# Patient Record
Sex: Female | Born: 1945 | Race: Asian | Hispanic: No | Marital: Married
Health system: Southern US, Community
[De-identification: ages and names within clinical notes are randomized; demographics above are authoritative.]

---

## 1998-05-30 ENCOUNTER — Other Ambulatory Visit: Admission: RE | Admit: 1998-05-30 | Discharge: 1998-05-30 | Payer: Self-pay | Admitting: Obstetrics and Gynecology

## 1998-06-01 ENCOUNTER — Other Ambulatory Visit: Admission: RE | Admit: 1998-06-01 | Discharge: 1998-06-01 | Payer: Self-pay | Admitting: Obstetrics and Gynecology

## 1999-03-22 ENCOUNTER — Other Ambulatory Visit: Admission: RE | Admit: 1999-03-22 | Discharge: 1999-03-22 | Payer: Self-pay | Admitting: Obstetrics and Gynecology

## 1999-04-20 ENCOUNTER — Ambulatory Visit (HOSPITAL_COMMUNITY): Admission: RE | Admit: 1999-04-20 | Discharge: 1999-04-20 | Payer: Self-pay | Admitting: *Deleted

## 1999-09-28 ENCOUNTER — Other Ambulatory Visit: Admission: RE | Admit: 1999-09-28 | Discharge: 1999-09-28 | Payer: Self-pay | Admitting: Gynecology

## 2000-03-07 ENCOUNTER — Encounter: Admission: RE | Admit: 2000-03-07 | Discharge: 2000-03-07 | Payer: Self-pay | Admitting: Gastroenterology

## 2000-03-07 ENCOUNTER — Encounter: Payer: Self-pay | Admitting: Gastroenterology

## 2000-06-26 ENCOUNTER — Emergency Department (HOSPITAL_COMMUNITY): Admission: EM | Admit: 2000-06-26 | Discharge: 2000-06-26 | Payer: Self-pay | Admitting: Emergency Medicine

## 2000-06-26 ENCOUNTER — Encounter: Payer: Self-pay | Admitting: Emergency Medicine

## 2000-06-27 ENCOUNTER — Encounter: Payer: Self-pay | Admitting: Emergency Medicine

## 2000-06-27 ENCOUNTER — Emergency Department (HOSPITAL_COMMUNITY): Admission: EM | Admit: 2000-06-27 | Discharge: 2000-06-28 | Payer: Self-pay | Admitting: Emergency Medicine

## 2001-08-13 ENCOUNTER — Other Ambulatory Visit: Admission: RE | Admit: 2001-08-13 | Discharge: 2001-08-13 | Payer: Self-pay | Admitting: Obstetrics and Gynecology

## 2001-08-20 ENCOUNTER — Encounter: Admission: RE | Admit: 2001-08-20 | Discharge: 2001-08-20 | Payer: Self-pay | Admitting: Obstetrics and Gynecology

## 2001-08-20 ENCOUNTER — Encounter: Payer: Self-pay | Admitting: Obstetrics and Gynecology

## 2002-09-10 ENCOUNTER — Other Ambulatory Visit: Admission: RE | Admit: 2002-09-10 | Discharge: 2002-09-10 | Payer: Self-pay | Admitting: Gynecology

## 2003-01-04 ENCOUNTER — Emergency Department (HOSPITAL_COMMUNITY): Admission: EM | Admit: 2003-01-04 | Discharge: 2003-01-04 | Payer: Self-pay | Admitting: Emergency Medicine

## 2003-01-04 ENCOUNTER — Encounter: Payer: Self-pay | Admitting: Emergency Medicine

## 2003-01-20 ENCOUNTER — Emergency Department (HOSPITAL_COMMUNITY): Admission: EM | Admit: 2003-01-20 | Discharge: 2003-01-20 | Payer: Self-pay | Admitting: Emergency Medicine

## 2003-01-20 ENCOUNTER — Encounter: Payer: Self-pay | Admitting: Emergency Medicine

## 2004-05-12 ENCOUNTER — Other Ambulatory Visit: Admission: RE | Admit: 2004-05-12 | Discharge: 2004-05-12 | Payer: Self-pay | Admitting: Gynecology

## 2006-03-22 ENCOUNTER — Encounter: Admission: RE | Admit: 2006-03-22 | Discharge: 2006-03-22 | Payer: Self-pay | Admitting: Otolaryngology

## 2007-01-29 ENCOUNTER — Other Ambulatory Visit: Admission: RE | Admit: 2007-01-29 | Discharge: 2007-01-29 | Payer: Self-pay | Admitting: Gynecology

## 2008-03-29 ENCOUNTER — Ambulatory Visit: Payer: Self-pay | Admitting: Nurse Practitioner

## 2008-03-29 DIAGNOSIS — J309 Allergic rhinitis, unspecified: Secondary | ICD-10-CM | POA: Insufficient documentation

## 2008-03-29 DIAGNOSIS — J45909 Unspecified asthma, uncomplicated: Secondary | ICD-10-CM | POA: Insufficient documentation

## 2008-03-30 ENCOUNTER — Encounter (INDEPENDENT_AMBULATORY_CARE_PROVIDER_SITE_OTHER): Payer: Self-pay | Admitting: Nurse Practitioner

## 2008-03-31 ENCOUNTER — Ambulatory Visit (HOSPITAL_COMMUNITY): Admission: RE | Admit: 2008-03-31 | Discharge: 2008-03-31 | Payer: Self-pay | Admitting: Nurse Practitioner

## 2008-04-06 ENCOUNTER — Ambulatory Visit: Payer: Self-pay | Admitting: Nurse Practitioner

## 2008-04-06 DIAGNOSIS — K219 Gastro-esophageal reflux disease without esophagitis: Secondary | ICD-10-CM | POA: Insufficient documentation

## 2008-04-06 DIAGNOSIS — F17200 Nicotine dependence, unspecified, uncomplicated: Secondary | ICD-10-CM | POA: Insufficient documentation

## 2008-04-26 ENCOUNTER — Ambulatory Visit: Payer: Self-pay | Admitting: Nurse Practitioner

## 2008-04-26 DIAGNOSIS — Z780 Asymptomatic menopausal state: Secondary | ICD-10-CM | POA: Insufficient documentation

## 2008-04-26 DIAGNOSIS — R030 Elevated blood-pressure reading, without diagnosis of hypertension: Secondary | ICD-10-CM | POA: Insufficient documentation

## 2008-04-26 DIAGNOSIS — K921 Melena: Secondary | ICD-10-CM | POA: Insufficient documentation

## 2008-04-26 LAB — CONVERTED CEMR LAB

## 2008-05-03 ENCOUNTER — Encounter (INDEPENDENT_AMBULATORY_CARE_PROVIDER_SITE_OTHER): Payer: Self-pay | Admitting: Nurse Practitioner

## 2008-05-03 DIAGNOSIS — D649 Anemia, unspecified: Secondary | ICD-10-CM

## 2008-05-03 DIAGNOSIS — E0590 Thyrotoxicosis, unspecified without thyrotoxic crisis or storm: Secondary | ICD-10-CM | POA: Insufficient documentation

## 2008-08-19 ENCOUNTER — Ambulatory Visit: Payer: Self-pay | Admitting: Nurse Practitioner

## 2008-08-19 DIAGNOSIS — K006 Disturbances in tooth eruption: Secondary | ICD-10-CM

## 2008-08-19 DIAGNOSIS — Z862 Personal history of diseases of the blood and blood-forming organs and certain disorders involving the immune mechanism: Secondary | ICD-10-CM

## 2008-08-19 DIAGNOSIS — Z8639 Personal history of other endocrine, nutritional and metabolic disease: Secondary | ICD-10-CM

## 2008-08-20 ENCOUNTER — Encounter (INDEPENDENT_AMBULATORY_CARE_PROVIDER_SITE_OTHER): Payer: Self-pay | Admitting: Nurse Practitioner

## 2008-08-20 LAB — CONVERTED CEMR LAB
HCT: 33.4 % — ABNORMAL LOW (ref 36.0–46.0)
Hemoglobin: 11 g/dL — ABNORMAL LOW (ref 12.0–15.0)
MCV: 73.7 fL — ABNORMAL LOW (ref 78.0–100.0)
WBC: 6.3 10*3/uL (ref 4.0–10.5)

## 2008-08-23 ENCOUNTER — Encounter (INDEPENDENT_AMBULATORY_CARE_PROVIDER_SITE_OTHER): Payer: Self-pay | Admitting: Nurse Practitioner

## 2008-09-01 ENCOUNTER — Ambulatory Visit: Payer: Self-pay | Admitting: *Deleted

## 2009-02-16 ENCOUNTER — Ambulatory Visit: Payer: Self-pay | Admitting: Nurse Practitioner

## 2009-02-16 DIAGNOSIS — M542 Cervicalgia: Secondary | ICD-10-CM

## 2009-02-16 DIAGNOSIS — M549 Dorsalgia, unspecified: Secondary | ICD-10-CM | POA: Insufficient documentation

## 2009-02-23 ENCOUNTER — Encounter (INDEPENDENT_AMBULATORY_CARE_PROVIDER_SITE_OTHER): Payer: Self-pay | Admitting: Nurse Practitioner

## 2009-08-10 ENCOUNTER — Ambulatory Visit: Payer: Self-pay | Admitting: Physician Assistant

## 2009-08-10 ENCOUNTER — Encounter (INDEPENDENT_AMBULATORY_CARE_PROVIDER_SITE_OTHER): Payer: Self-pay | Admitting: Nurse Practitioner

## 2009-08-10 DIAGNOSIS — R109 Unspecified abdominal pain: Secondary | ICD-10-CM | POA: Insufficient documentation

## 2009-08-10 LAB — CONVERTED CEMR LAB
Bilirubin Urine: NEGATIVE
Ketones, urine, test strip: NEGATIVE
Nitrite: NEGATIVE
Protein, U semiquant: NEGATIVE
Urobilinogen, UA: 0.2

## 2009-08-11 LAB — CONVERTED CEMR LAB
Albumin: 4.7 g/dL (ref 3.5–5.2)
Alkaline Phosphatase: 59 units/L (ref 39–117)
BUN: 13 mg/dL (ref 6–23)
Basophils Absolute: 0.2 10*3/uL — ABNORMAL HIGH (ref 0.0–0.1)
Basophils Relative: 2 % — ABNORMAL HIGH (ref 0–1)
Creatinine, Ser: 0.63 mg/dL (ref 0.40–1.20)
Eosinophils Absolute: 0.6 10*3/uL (ref 0.0–0.7)
Eosinophils Relative: 7 % — ABNORMAL HIGH (ref 0–5)
Glucose, Bld: 88 mg/dL (ref 70–99)
HCT: 34.7 % — ABNORMAL LOW (ref 36.0–46.0)
Hemoglobin: 11.2 g/dL — ABNORMAL LOW (ref 12.0–15.0)
Lipase: 20 units/L (ref 0–75)
MCHC: 32.3 g/dL (ref 30.0–36.0)
MCV: 74.9 fL — ABNORMAL LOW (ref 78.0–100.0)
Monocytes Absolute: 0.6 10*3/uL (ref 0.1–1.0)
Potassium: 4.6 meq/L (ref 3.5–5.3)
RDW: 12.9 % (ref 11.5–15.5)
Total Bilirubin: 0.3 mg/dL (ref 0.3–1.2)

## 2009-08-17 ENCOUNTER — Ambulatory Visit (HOSPITAL_COMMUNITY): Admission: RE | Admit: 2009-08-17 | Discharge: 2009-08-17 | Payer: Self-pay | Admitting: Internal Medicine

## 2010-06-13 ENCOUNTER — Ambulatory Visit: Payer: Self-pay | Admitting: Nurse Practitioner

## 2010-06-13 LAB — CONVERTED CEMR LAB
Blood in Urine, dipstick: NEGATIVE
Glucose, Urine, Semiquant: NEGATIVE
Specific Gravity, Urine: 1.02
WBC Urine, dipstick: NEGATIVE
pH: 5

## 2010-06-15 ENCOUNTER — Encounter (INDEPENDENT_AMBULATORY_CARE_PROVIDER_SITE_OTHER): Payer: Self-pay | Admitting: *Deleted

## 2010-06-22 LAB — CONVERTED CEMR LAB
ALT: 19 units/L (ref 0–35)
Basophils Absolute: 0.1 10*3/uL (ref 0.0–0.1)
CO2: 28 meq/L (ref 19–32)
Calcium: 9.7 mg/dL (ref 8.4–10.5)
Chloride: 108 meq/L (ref 96–112)
Cholesterol: 183 mg/dL (ref 0–200)
Hemoglobin: 11 g/dL — ABNORMAL LOW (ref 12.0–15.0)
Lymphocytes Relative: 20 % (ref 12–46)
Neutro Abs: 3.8 10*3/uL (ref 1.7–7.7)
Platelets: 338 10*3/uL (ref 150–400)
Potassium: 4.5 meq/L (ref 3.5–5.3)
RDW: 14 % (ref 11.5–15.5)
Sodium: 145 meq/L (ref 135–145)
Total Protein: 7.2 g/dL (ref 6.0–8.3)

## 2010-08-06 ENCOUNTER — Encounter: Payer: Self-pay | Admitting: Family Medicine

## 2010-08-08 ENCOUNTER — Ambulatory Visit: Admit: 2010-08-08 | Payer: Self-pay | Admitting: Nurse Practitioner

## 2010-08-13 LAB — CONVERTED CEMR LAB
ALT: 40 units/L — ABNORMAL HIGH (ref 0–35)
AST: 32 units/L (ref 0–37)
BUN: 14 mg/dL (ref 6–23)
Basophils Absolute: 0.1 10*3/uL (ref 0.0–0.1)
Basophils Relative: 1 % (ref 0–1)
Calcium: 9.5 mg/dL (ref 8.4–10.5)
Chloride: 106 meq/L (ref 96–112)
Cholesterol: 160 mg/dL (ref 0–200)
Creatinine, Ser: 0.57 mg/dL (ref 0.40–1.20)
Eosinophils Relative: 1 % (ref 0–5)
HCT: 35.1 % — ABNORMAL LOW (ref 36.0–46.0)
HDL: 83 mg/dL (ref >39)
Helicobacter pylori, IgM: 30
Hemoglobin: 11.3 g/dL — ABNORMAL LOW (ref 12.0–15.0)
Ketones, urine, test strip: NEGATIVE
MCHC: 32.2 g/dL (ref 30.0–36.0)
MCV: 75.8 fL — ABNORMAL LOW (ref 78.0–100.0)
Monocytes Absolute: 0.4 10*3/uL (ref 0.1–1.0)
Monocytes Relative: 5 % (ref 3–12)
Neutro Abs: 5.5 10*3/uL (ref 1.7–7.7)
Nitrite: NEGATIVE
RBC: 4.63 M/uL (ref 3.87–5.11)
RDW: 13.9 % (ref 11.5–15.5)
Specific Gravity, Urine: 1.02
TSH: 0.067 microintl units/mL — ABNORMAL LOW (ref 0.350–4.50)
Total Bilirubin: 0.7 mg/dL (ref 0.3–1.2)
Total CHOL/HDL Ratio: 1.9
Urobilinogen, UA: 0.2
VLDL: 14 mg/dL (ref 0–40)
WBC Urine, dipstick: NEGATIVE

## 2010-08-17 NOTE — Letter (Signed)
Summary: *HSN Results Follow up  [facility]  [address] [place], [place] [address]   Phone: [phone]  Fax: [phone]      06/15/2010   Morgan Conway [address] [place], [place]  [address]   Dear  Ms. Silvio Pate Carnell,                            ____S.Drinkard,FNP   ____D. Gore,FNP       ____B. McPherson,MD   ____V. Rankins,MD    ____E. Mulberry,MD    ____N. Daphine Deutscher, FNP  ____D. Reche Dixon, MD    ____K. Philipp Deputy, MD    ____Other     This letter is to inform you that your recent test(s):  _______Pap Smear    _______Lab Test     _______X-ray    _______ is within acceptable limits  _______ requires a medication change  _______ requires a follow-up lab visit  _______ requires a follow-up visit with your provider   Comments:  We have been trying to reach you at [phone].  Please contact the office at your earliest convenience.       _________________________________________________________ If you have any questions, please contact our office                     Sincerely,  Levon Hedger [facility]

## 2010-08-17 NOTE — Letter (Signed)
Summary: TEST ORDER FORM//ULTRASOUND /ABDOMINAL PAIN//APPT DATE & TIME  TEST ORDER FORM//ULTRASOUND /ABDOMINAL PAIN//APPT DATE & TIME   Imported By: Arta Bruce 09/05/2009 12:35:09  _____________________________________________________________________  External Attachment:    Type:   Image     Comment:   External Document
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
## 2010-08-17 NOTE — Assessment & Plan Note (Signed)
Summary: Acute - Allergic Rhinitis   Vital Signs:  Patient profile:   65 year old female Weight:      101.1 pounds BMI:     22.75 Temp:     97.1 degrees F oral Pulse rate:   72 / minute Pulse rhythm:   regular Resp:     16 per minute BP sitting:   130 / 90  (left arm) Cuff size:   regular  Vitals Entered By: Levon Hedger (June 13, 2010 10:03 AM) CC: iching in ears, itching in head and pain in neck for a while and wants to be seen for this, Abdominal Pain, Back Pain Is Patient Diabetic? No Pain Assessment Patient in pain? no       Does patient need assistance? Functional Status Self care Ambulation Normal   CC:  iching in ears, itching in head and pain in neck for a while and wants to be seen for this, Abdominal Pain, and Back Pain.  History of Present Illness:  Pt into the office to f/u for headache. However pt c/o itchy ears and head. Internal canal itching +sneezing +nasal irritation -frontal headache -fever Pt previously prescribed claritin in 2009 but she has taken in over 1 year  Interpreter present today with pt - speaks [place]  Back Pain History:            Other comments:  no complaints of pain today.    Dyspepsia History:      She has no alarm features of dyspepsia including no history of melena, hematochezia, dysphagia, persistent vomiting, or involuntary weight loss > 5%.  There is a prior history of GERD.  The patient does not have a prior history of documented ulcer disease.      Allergies (verified): No Known Drug Allergies  Review of Systems General:  Denies fever. ENT:  Complains of nasal congestion and sinus pressure; denies earache and sore throat; +itchy ears. CV:  Denies chest pain or discomfort. Resp:  Denies cough. GI:  Complains of indigestion; took nexium this morning for indigestion.  Physical Exam  General:  alert.   Head:  normocephalic.   Eyes:  pupils round.   Ears:  bil clear fluid behind TM - no  erythema Lungs:  normal breath sounds.   Heart:  normal rate and regular rhythm.   Abdomen:  normal bowel sounds.   Msk:  up to the exam table Neurologic:  alert & oriented X3.   Skin:  color normal.   Psych:  Oriented X3.     Impression & Recommendations:  Problem # 1:  ALLERGIC RHINITIS (ICD-477.9) advised pt of dx will start meds Her updated medication list for this problem includes:    Loratadine 10 Mg Tabs (Loratadine) ..... One tablet by mouth daily for alleriges    Fluticasone Propionate 50 Mcg/act Susp (Fluticasone propionate) ..... One spray in each nostril two times a day **hold head down**  Problem # 2:  ELEVATED BLOOD PRESSURE WITHOUT DIAGNOSIS OF HYPERTENSION (ICD-796.2) will continue to monitor  Problem # 3:  HYPERTHYROIDISM (ICD-242.90) will check labs today Orders: T-TSH ([id number])  Problem # 4:  NEED PROPHYLACTIC VACCINATION&INOCULATION FLU (ICD-V04.81) given today in office  Problem # 5:  ANEMIA (ICD-285.9) given today in office Her updated medication list for this problem includes:    Ferrous Sulfate 324 Mg Tbec (Ferrous sulfate) .Marland Kitchen... 1 tablet by mouth daily to build up blood  Orders: T-Lipid Profile ([id number]) T-CBC w/Diff ([id number])  Complete Medication List: 1)  Loratadine  10 Mg Tabs (Loratadine) .... One tablet by mouth daily for alleriges 2)  Nexium 40 Mg Cpdr (Esomeprazole magnesium) .Marland Kitchen.. 1 tablet by mouth daily before breakfast 3)  Ferrous Sulfate 324 Mg Tbec (Ferrous sulfate) .Marland Kitchen.. 1 tablet by mouth daily to build up blood 4)  Fluticasone Propionate 50 Mcg/act Susp (Fluticasone propionate) .... One spray in each nostril two times a day **hold head down**  Other Orders: Flu Vaccine 89yrs + ([address]109) Admin 1st Vaccine (60454) T-Comprehensive Metabolic Panel ([id number]) UA Dipstick w/o Micro (manual) (29562)  Patient Instructions: 1)  You have received the flu vaccine today 2)  Your labs will be checked and you will be notified of  the results 3)  Ear itching and nasal congestion is most likely due to inflammed sinuses. 4)  You should take lorantadine 10mg  by mouth daily 5)  Also use the nasal spray two times a day (hold head down) 6)  Your complete physical exam is past due.  If you decide to have this done then schedule an appointment. Prescriptions: FLUTICASONE PROPIONATE 50 MCG/ACT SUSP (FLUTICASONE PROPIONATE) One spray in each nostril two times a day **hold head down**  #1 x 5   Entered and Authorized by:   Lehman Prom FNP   Signed by:   Lehman Prom FNP on 06/13/2010   Method used:   Print then Give to Patient   RxID:   [id number] LORATADINE 10 MG TABS (LORATADINE) One tablet by mouth daily for alleriges  #30 x 5   Entered and Authorized by:   Lehman Prom FNP   Signed by:   Lehman Prom FNP on 06/13/2010   Method used:   Print then Give to Patient   RxID:   [id number]    Orders Added: 1)  Flu Vaccine 6yrs + [64403] 2)  Admin 1st Vaccine [90471] 3)  Est. Patient Level III [47425] 4)  T-Lipid Profile [95638-75643] 5)  T-Comprehensive Metabolic Panel [80053-22900] 6)  T-CBC w/Diff [32951-88416] 7)  T-TSH [60630-16010] 8)  UA Dipstick w/o Micro (manual) [81002]   Immunizations Administered:  Influenza Vaccine # 1:    Vaccine Type: Fluvax 3+    Site: right deltoid    Mfr: GlaxoSmithKline    Dose: 0.5 ml    Route: IM    Given by: Levon Hedger    Exp. Date: 01/13/2011    Lot #: [id number]    VIS given: 02/07/10 version given June 13, 2010.  Flu Vaccine Consent Questions:    Do you have a history of severe allergic reactions to this vaccine? no    Any prior history of allergic reactions to egg and/or gelatin? no    Do you have a sensitivity to the preservative Thimersol? no    Do you have a past history of Guillan-Barre Syndrome? no    Do you currently have an acute febrile illness? no    Have you ever had a severe reaction to latex? no    Vaccine information  given and explained to patient? yes    Are you currently pregnant? no    ndc  [phone]  Immunizations Administered:  Influenza Vaccine # 1:    Vaccine Type: Fluvax 3+    Site: right deltoid    Mfr: GlaxoSmithKline    Dose: 0.5 ml    Route: IM    Given by: Levon Hedger    Exp. Date: 01/13/2011    Lot #: [id number]    VIS given: 02/07/10 version given June 13, 2010.  Laboratory Results  Urine Tests  Date/Time Received: June 13, 2010 10:17 AM   Routine Urinalysis   Color: lt. yellow Appearance: Clear Glucose: negative   (Normal Range: Negative) Bilirubin: negative   (Normal Range: Negative) Ketone: negative   (Normal Range: Negative) Spec. Gravity: 1.020   (Normal Range: 1.003-1.035) Blood: negative   (Normal Range: Negative) pH: 5.0   (Normal Range: 5.0-8.0) Protein: negative   (Normal Range: Negative) Urobilinogen: 0.2   (Normal Range: 0-1) Nitrite: negative   (Normal Range: Negative) Leukocyte Esterace: negative   (Normal Range: Negative)          Prevention & Chronic Care Immunizations   Influenza vaccine: Fluvax 3+  (06/13/2010)    Tetanus booster: 04/26/2008: Tdap    Pneumococcal vaccine: Not documented    H. zoster vaccine: Not documented  Colorectal Screening   Hemoccult: Not documented    Colonoscopy: Not documented  Other Screening   Pap smear:  Specimen Adequacy: Satisfactory for evaluation.   Interpretation/Result:Negative for intraepithelial Lesion or Malignancy.     (04/26/2008)   Pap smear action/deferral: PAP smear done  (04/26/2008)    Mammogram: Not documented   Mammogram action/deferral: mammogram ordered  (04/26/2008)    DXA bone density scan: Not documented   Smoking status: current  (02/16/2009)   Smoking cessation counseling: yes  (04/26/2008)  Lipids   Total Cholesterol: 160  (04/26/2008)   LDL: 63  (04/26/2008)   LDL Direct: Not documented   HDL: 83  (04/26/2008)   Triglycerides: 72  (04/26/2008)

## 2013-04-29 ENCOUNTER — Ambulatory Visit
Admission: RE | Admit: 2013-04-29 | Discharge: 2013-04-29 | Disposition: A | Discharge status: Home / Self Care | Payer: Medicare Other | Source: Ambulatory Visit | Attending: Internal Medicine | Admitting: Internal Medicine

## 2013-04-29 ENCOUNTER — Other Ambulatory Visit: Payer: Self-pay | Admitting: Internal Medicine

## 2013-04-29 DIAGNOSIS — R52 Pain, unspecified: Secondary | ICD-10-CM

## 2013-04-29 DIAGNOSIS — M542 Cervicalgia: Secondary | ICD-10-CM

## 2013-05-12 ENCOUNTER — Other Ambulatory Visit: Payer: Self-pay | Admitting: Internal Medicine

## 2013-05-12 DIAGNOSIS — Z1231 Encounter for screening mammogram for malignant neoplasm of breast: Secondary | ICD-10-CM

## 2013-06-04 ENCOUNTER — Ambulatory Visit
Admission: RE | Admit: 2013-06-04 | Discharge: 2013-06-04 | Disposition: A | Discharge status: Home / Self Care | Payer: Medicare Other | Source: Ambulatory Visit | Attending: Internal Medicine | Admitting: Internal Medicine

## 2013-06-04 DIAGNOSIS — Z1231 Encounter for screening mammogram for malignant neoplasm of breast: Secondary | ICD-10-CM

## 2013-08-05 ENCOUNTER — Other Ambulatory Visit: Payer: Self-pay | Admitting: Internal Medicine

## 2013-08-05 DIAGNOSIS — R1030 Lower abdominal pain, unspecified: Secondary | ICD-10-CM

## 2013-08-12 ENCOUNTER — Ambulatory Visit
Admission: RE | Admit: 2013-08-12 | Discharge: 2013-08-12 | Disposition: A | Discharge status: Home / Self Care | Payer: Medicare Other | Source: Ambulatory Visit | Attending: Internal Medicine | Admitting: Internal Medicine

## 2013-08-12 DIAGNOSIS — R1030 Lower abdominal pain, unspecified: Secondary | ICD-10-CM

## 2014-02-10 ENCOUNTER — Other Ambulatory Visit: Payer: Self-pay | Admitting: Internal Medicine

## 2014-02-10 DIAGNOSIS — M8580 Other specified disorders of bone density and structure, unspecified site: Secondary | ICD-10-CM

## 2014-03-01 ENCOUNTER — Ambulatory Visit
Admission: RE | Admit: 2014-03-01 | Discharge: 2014-03-01 | Disposition: A | Discharge status: Home / Self Care | Payer: Medicare Other | Source: Ambulatory Visit | Attending: Internal Medicine | Admitting: Internal Medicine

## 2014-03-01 DIAGNOSIS — M8580 Other specified disorders of bone density and structure, unspecified site: Secondary | ICD-10-CM

## 2014-06-28 ENCOUNTER — Other Ambulatory Visit: Payer: Self-pay | Admitting: Internal Medicine

## 2014-06-28 DIAGNOSIS — R52 Pain, unspecified: Secondary | ICD-10-CM

## 2014-07-05 ENCOUNTER — Ambulatory Visit
Admission: RE | Admit: 2014-07-05 | Discharge: 2014-07-05 | Disposition: A | Discharge status: Home / Self Care | Payer: Medicare Other | Source: Ambulatory Visit | Attending: Internal Medicine | Admitting: Internal Medicine

## 2014-07-05 ENCOUNTER — Other Ambulatory Visit: Payer: Medicare Other

## 2014-07-05 DIAGNOSIS — R52 Pain, unspecified: Secondary | ICD-10-CM

## 2015-09-08 ENCOUNTER — Ambulatory Visit
Admission: RE | Admit: 2015-09-08 | Discharge: 2015-09-08 | Disposition: A | Discharge status: Home / Self Care | Payer: Medicare Other | Source: Ambulatory Visit | Attending: Internal Medicine | Admitting: Internal Medicine

## 2015-09-08 ENCOUNTER — Other Ambulatory Visit: Payer: Self-pay | Admitting: Internal Medicine

## 2015-09-08 DIAGNOSIS — R05 Cough: Secondary | ICD-10-CM

## 2015-09-08 DIAGNOSIS — R059 Cough, unspecified: Secondary | ICD-10-CM

## 2022-01-03 ENCOUNTER — Emergency Department (HOSPITAL_COMMUNITY): Payer: Medicare Other

## 2022-01-03 ENCOUNTER — Encounter (HOSPITAL_COMMUNITY): Payer: Self-pay

## 2022-01-03 ENCOUNTER — Other Ambulatory Visit: Payer: Self-pay

## 2022-01-03 ENCOUNTER — Emergency Department (HOSPITAL_COMMUNITY)
Admission: EM | Admit: 2022-01-03 | Discharge: 2022-01-03 | Disposition: A | Discharge status: Home / Self Care | Payer: Medicare Other | Attending: Emergency Medicine | Admitting: Emergency Medicine

## 2022-01-03 DIAGNOSIS — D72829 Elevated white blood cell count, unspecified: Secondary | ICD-10-CM | POA: Diagnosis not present

## 2022-01-03 DIAGNOSIS — N201 Calculus of ureter: Secondary | ICD-10-CM | POA: Diagnosis not present

## 2022-01-03 DIAGNOSIS — R1032 Left lower quadrant pain: Secondary | ICD-10-CM | POA: Diagnosis present

## 2022-01-03 DIAGNOSIS — R630 Anorexia: Secondary | ICD-10-CM | POA: Insufficient documentation

## 2022-01-03 LAB — URINALYSIS, ROUTINE W REFLEX MICROSCOPIC
Bilirubin Urine: NEGATIVE
Glucose, UA: NEGATIVE mg/dL
Ketones, ur: NEGATIVE mg/dL
Leukocytes,Ua: NEGATIVE
Nitrite: NEGATIVE
Protein, ur: NEGATIVE mg/dL
Specific Gravity, Urine: 1.002 — ABNORMAL LOW (ref 1.005–1.030)
pH: 5 (ref 5.0–8.0)

## 2022-01-03 LAB — COMPREHENSIVE METABOLIC PANEL
ALT: 14 U/L (ref 0–44)
AST: 20 U/L (ref 15–41)
Albumin: 4.4 g/dL (ref 3.5–5.0)
Alkaline Phosphatase: 65 U/L (ref 38–126)
Anion gap: 13 (ref 5–15)
BUN: 6 mg/dL — ABNORMAL LOW (ref 8–23)
CO2: 26 mmol/L (ref 22–32)
Calcium: 10.1 mg/dL (ref 8.9–10.3)
Chloride: 102 mmol/L (ref 98–111)
Creatinine, Ser: 0.79 mg/dL (ref 0.44–1.00)
GFR, Estimated: >60 mL/min (ref >60)
Glucose, Bld: 121 mg/dL — ABNORMAL HIGH (ref 70–99)
Potassium: 4.4 mmol/L (ref 3.5–5.1)
Sodium: 141 mmol/L (ref 135–145)
Total Bilirubin: 0.6 mg/dL (ref 0.3–1.2)
Total Protein: 8 g/dL (ref 6.5–8.1)

## 2022-01-03 LAB — CBC
HCT: 35.4 % — ABNORMAL LOW (ref 36.0–46.0)
Hemoglobin: 11.6 g/dL — ABNORMAL LOW (ref 12.0–15.0)
MCH: 24.5 pg — ABNORMAL LOW (ref 26.0–34.0)
MCHC: 32.8 g/dL (ref 30.0–36.0)
MCV: 74.7 fL — ABNORMAL LOW (ref 80.0–100.0)
Platelets: 421 K/uL — ABNORMAL HIGH (ref 150–400)
RBC: 4.74 MIL/uL (ref 3.87–5.11)
RDW: 13.2 % (ref 11.5–15.5)
WBC: 14.5 K/uL — ABNORMAL HIGH (ref 4.0–10.5)
nRBC: 0 % (ref 0.0–0.2)

## 2022-01-03 LAB — LIPASE, BLOOD: Lipase: 38 U/L (ref 11–51)

## 2022-01-03 LAB — LACTIC ACID, PLASMA: Lactic Acid, Venous: 2.1 mmol/L (ref 0.5–1.9)

## 2022-01-03 MED ORDER — LACTATED RINGERS IV BOLUS
1000.0000 mL | Freq: Once | INTRAVENOUS | Status: AC
  Administered 2022-01-03: 1000 mL via INTRAVENOUS

## 2022-01-03 MED ORDER — KETOROLAC TROMETHAMINE 30 MG/ML IJ SOLN
15.0000 mg | Freq: Once | INTRAMUSCULAR | Status: DC
  Filled 2022-01-03: qty 1

## 2022-01-03 MED ORDER — ONDANSETRON HCL 4 MG/2ML IJ SOLN
4.0000 mg | Freq: Once | INTRAMUSCULAR | Status: DC
  Filled 2022-01-03: qty 2

## 2022-01-03 MED ORDER — ONDANSETRON 4 MG PO TBDP
4.0000 mg | ORAL_TABLET | Freq: Three times a day (TID) | ORAL | 0 refills | Status: AC | PRN
Start: 2022-01-03 — End: ?

## 2022-01-03 MED ORDER — CEPHALEXIN 500 MG PO CAPS: 500.0000 mg | ORAL_CAPSULE | Freq: Three times a day (TID) | ORAL | 0 refills | Status: AC

## 2022-01-03 MED ORDER — FENTANYL CITRATE PF 50 MCG/ML IJ SOSY
50.0000 ug | PREFILLED_SYRINGE | Freq: Once | INTRAMUSCULAR | Status: AC
  Administered 2022-01-03: 50 ug via INTRAVENOUS
  Filled 2022-01-03: qty 1

## 2022-01-03 MED ORDER — IBUPROFEN 600 MG PO TABS: 600.0000 mg | ORAL_TABLET | Freq: Three times a day (TID) | ORAL | 0 refills | Status: AC | PRN

## 2022-01-03 MED ORDER — IOHEXOL 300 MG/ML  SOLN
100.0000 mL | Freq: Once | INTRAMUSCULAR | Status: AC | PRN
  Administered 2022-01-03: 100 mL via INTRAVENOUS

## 2022-01-03 MED ORDER — ONDANSETRON HCL 4 MG/2ML IJ SOLN
4.0000 mg | Freq: Once | INTRAMUSCULAR | Status: AC
  Administered 2022-01-03: 4 mg via INTRAVENOUS
  Filled 2022-01-03: qty 2

## 2022-01-03 MED ORDER — SODIUM CHLORIDE 0.9 % IV SOLN
1.0000 g | Freq: Once | INTRAVENOUS | Status: AC
  Administered 2022-01-03: 1 g via INTRAVENOUS
  Filled 2022-01-03: qty 10

## 2022-01-03 MED ORDER — OXYCODONE-ACETAMINOPHEN 5-325 MG PO TABS
1.0000 | ORAL_TABLET | Freq: Once | ORAL | Status: AC
  Administered 2022-01-03: 1 via ORAL
  Filled 2022-01-03: qty 1

## 2022-01-03 NOTE — Discharge Instructions (Signed)
As we discussed, you have a small kidney stone.  You should be able to pass this on your own.  Take the pain medication you have at home as well as the ibuprofen.  You take ibuprofen every 6-8 hours for inflammation with your Vicodin in between.  Follow-up with the urologist.  Take the antibiotics as prescribed.  Return to the ED with increased pain, fever, vomiting, unable to urinate or any other concerns

## 2022-01-03 NOTE — ED Notes (Signed)
Patient transported to CT 

## 2022-01-03 NOTE — ED Triage Notes (Signed)
Pt arrived POV from home c/o sharp stabbing LLQ abdominal pain that started 3 days ago. Pt denies any N/V. Pt states she feels like something is sitting on her bladder.

## 2022-01-03 NOTE — ED Provider Notes (Signed)
[facility] EMERGENCY DEPARTMENT Provider Note   CSN: [id number] Arrival date & time: 01/03/22  1334     History  Chief Complaint  Patient presents with   Abdominal Pain    Morgan Conway is a 76 y.o. female.  Level 5 caveat for language barrier.  Family at bedside is able to translate.  Patient reports a 3-day history of progressively worsening lower abdominal pain worse in the midline in the left lower quadrant.  Pain is fairly constant and nothing makes it better and is steadily getting worse.  Associated with anorexia, poor appetite, nausea but no vomiting.  Did have 1 episode of diarrhea today after using MiraLAX.  Otherwise is moving her bowels normally.  Denies constipation or diarrhea history.  No pain with urination or blood in the urine.  No vaginal bleeding or discharge.  No fevers, chills, chest pain or shortness of breath.  No previous history of kidney stones.  No previous history of abdominal surgeries.  The history is provided by the patient. The history is limited by a language barrier. A language interpreter was used.  Abdominal Pain Associated symptoms: nausea   Associated symptoms: no chest pain, no cough, no diarrhea, no dysuria, no fever, no hematuria, no shortness of breath and no vomiting        Home Medications Prior to Admission medications   Not on File      Allergies    Patient has no known allergies.    Review of Systems   Review of Systems  Constitutional:  Positive for activity change and appetite change. Negative for fever.  HENT:  Negative for congestion and rhinorrhea.   Respiratory:  Negative for cough, chest tightness and shortness of breath.   Cardiovascular:  Negative for chest pain.  Gastrointestinal:  Positive for abdominal pain and nausea. Negative for diarrhea and vomiting.  Genitourinary:  Positive for urgency. Negative for dysuria and hematuria.  Musculoskeletal:  Negative for arthralgias and myalgias.  Skin:   Negative for rash.  Neurological:  Negative for dizziness, weakness, light-headedness and headaches.   all other systems are negative except as noted in the HPI and PMH.    Physical Exam Updated Vital Signs BP (!) 149/77   Pulse 70   Temp 98.6 F (37 C) (Oral)   Resp 15   Ht 4\' 9"  (1.448 m)   Wt 49.9 kg   SpO2 100%   BMI 23.80 kg/m  Physical Exam Vitals and nursing note reviewed.  Constitutional:      General: She is not in acute distress.    Appearance: She is well-developed.  HENT:     Head: Normocephalic and atraumatic.     Mouth/Throat:     Pharynx: No oropharyngeal exudate.  Eyes:     Conjunctiva/sclera: Conjunctivae normal.     Pupils: Pupils are equal, round, and reactive to light.  Neck:     Comments: No meningismus. Cardiovascular:     Rate and Rhythm: Normal rate and regular rhythm.     Heart sounds: Normal heart sounds. No murmur heard. Pulmonary:     Effort: Pulmonary effort is normal. No respiratory distress.     Breath sounds: Normal breath sounds.  Abdominal:     Palpations: Abdomen is soft.     Tenderness: There is abdominal tenderness. There is guarding. There is no rebound.     Comments: Suprapubic and left lower quadrant tenderness with voluntary guarding, no rebound  Musculoskeletal:  General: Tenderness present. Normal range of motion.     Cervical back: Normal range of motion and neck supple.     Comments: Left CVA tenderness  Skin:    General: Skin is warm.  Neurological:     Mental Status: She is alert and oriented to person, place, and time.     Cranial Nerves: No cranial nerve deficit.     Motor: No abnormal muscle tone.     Coordination: Coordination normal.     Comments:  5/5 strength throughout. CN 2-12 intact.Equal grip strength.   Psychiatric:        Behavior: Behavior normal.     ED Results / Procedures / Treatments   Labs (all labs ordered are listed, but only abnormal results are displayed) Labs Reviewed   COMPREHENSIVE METABOLIC PANEL - Abnormal; Notable for the following components:      Result Value   Glucose, Bld 121 (*)    BUN 6 (*)    All other components within normal limits  CBC - Abnormal; Notable for the following components:   WBC 14.5 (*)    Hemoglobin 11.6 (*)    HCT 35.4 (*)    MCV 74.7 (*)    MCH 24.5 (*)    Platelets 421 (*)    All other components within normal limits  URINALYSIS, ROUTINE W REFLEX MICROSCOPIC - Abnormal; Notable for the following components:   Color, Urine STRAW (*)    Specific Gravity, Urine 1.002 (*)    Hgb urine dipstick LARGE (*)    Bacteria, UA RARE (*)    All other components within normal limits  LACTIC ACID, PLASMA - Abnormal; Notable for the following components:   Lactic Acid, Venous 2.1 (*)    All other components within normal limits  URINE CULTURE  LIPASE, BLOOD  LACTIC ACID, PLASMA    EKG None  Radiology CT ABDOMEN PELVIS W CONTRAST  Result Date: 01/03/2022 CLINICAL DATA:  Left lower quadrant pain for several days EXAM: CT ABDOMEN AND PELVIS WITH CONTRAST TECHNIQUE: Multidetector CT imaging of the abdomen and pelvis was performed using the standard protocol following bolus administration of intravenous contrast. RADIATION DOSE REDUCTION: This exam was performed according to the departmental dose-optimization program which includes automated exposure control, adjustment of the mA and/or kV according to patient size and/or use of iterative reconstruction technique. CONTRAST:  OMNIPAQUE IOHEXOL 300 MG/ML  SOLN COMPARISON:  None Available. FINDINGS: Lower chest: Mild dependent atelectatic changes are noted. No focal infiltrate is seen. Hepatobiliary: No focal liver abnormality is seen. No gallstones, gallbladder wall thickening, or biliary dilatation. Pancreas: Pancreas is atrophic. Spleen: Normal in size without focal abnormality. Adrenals/Urinary Tract: Adrenal glands are within normal limits. Kidneys are well visualized bilaterally  within normal enhancement pattern. A few small subcentimeter cysts are noted the left kidney. No follow-up is recommended. Fullness of the left renal collecting system and left ureter is seen which extends to the level of the ureterovesical junction. A punctate 1 mm stone is noted at that level causing the obstructive change. Additionally the insertion site is somewhat lower than expected. Bladder is well distended. Stomach/Bowel: Mild diverticular change of the colon is noted without evidence of diverticulitis. No obstructive or inflammatory changes are seen. The appendix is well visualized and within normal limits. No inflammatory changes are seen. Small bowel and stomach are within normal limits. Vascular/Lymphatic: Aortic atherosclerosis. No enlarged abdominal or pelvic lymph nodes. Reproductive: Uterus and bilateral adnexa are unremarkable. Other: No abdominal wall hernia  or abnormality. No abdominopelvic ascites. Musculoskeletal: No acute or significant osseous findings. IMPRESSION: 1 mm stone at the left UVJ with mild obstructive changes identified. Diverticulosis without diverticulitis. Electronically Signed   By: Alcide Clever M.D.   On: 01/03/2022 21:11    Procedures Procedures    Medications Ordered in ED Medications  lactated ringers bolus 1,000 mL (has no administration in time range)  fentaNYL (SUBLIMAZE) injection 50 mcg (has no administration in time range)  ondansetron (ZOFRAN) injection 4 mg (has no administration in time range)  oxyCODONE-acetaminophen (PERCOCET/ROXICET) 5-325 MG per tablet 1 tablet (1 tablet Oral Given 01/03/22 1518)    ED Course/ Medical Decision Making/ A&P                           Medical Decision Making Amount and/or Complexity of Data Reviewed Independent Historian: caregiver Labs: ordered. Decision-making details documented in ED Course. Radiology: ordered and independent interpretation performed. Decision-making details documented in ED  Course. ECG/medicine tests: ordered and independent interpretation performed. Decision-making details documented in ED Course.  Risk Prescription drug management.  3 days of left-sided abdominal pain with some urgency.  Vitals are stable, no distress, no fever, abdomen soft without peritoneal signs.  Work-up in triage shows hematuria without infection.  Elevated lactic acid as well as leukocytosis.  CT scan to be obtained.  Concern for possible kidney stone, possible diverticulitis, possible UTI, possible bowel obstruction.  CT scan remarkable for 1 mm left UVJ stone.  This likely explains her symptoms.  Results reviewed and interpreted by me.  Urinalysis shows hematuria without evidence of infection.  Culture is sent.  Does have leukocytosis of 14 but suspect this is reactive.  No fever  D/w Dr. Laverle Patter of urology who agrees with patient's well appearance, no obvious UTI, small size of stone, will treat empirically with antibiotics but defer stent at this time.  Urine culture is sent  Discussed pain and nausea control with patient and daughters.  She takes hydrocodone chronically at home but only had 1 dose today.  Discussed follow-up with urology.  Return to the ED with worsening pain, fever, vomiting, unable to urinate or any other concerns Will not prescribe additional narcotics given her supply of hydrocodone at home.         Final Clinical Impression(s) / ED Diagnoses Final diagnoses:  Ureteral stone    Rx / DC Orders ED Discharge Orders     None         Juanice Warburton, Jeannett Senior, MD 01/03/22 2336

## 2022-01-03 NOTE — ED Notes (Signed)
RN reviewed discharge instructions with pt. Pt verbalized understanding and had no further questions. VSS upon discharge.  

## 2022-01-03 NOTE — ED Provider Triage Note (Signed)
Emergency Medicine Provider Triage Evaluation Note  Morgan Conway , a 76 y.o. female  was evaluated in triage.  Pt complains of LLQ abd pain.  Daughter translating at bedside.   Sx started 3 days ago. 1 ep of nonbloody diarrhea today after miralax. No NV.   Seems she has some improvement w Bms.   No abd surgical hx. No hx of diverticulitis.   No urinary sx per her   Review of Systems  Positive: Abd  Negative: Fever   Physical Exam  BP (!) 187/95 (BP Location: Right Arm)   Pulse 87   Temp 99.5 F (37.5 C) (Oral)   Resp 14   Ht 4\' 9"  (1.448 m)   Wt 49.9 kg   SpO2 98%   BMI 23.80 kg/m  Gen:   Awake, no distress   Resp:  Normal effort  MSK:   Moves extremities without difficulty  Other:    Medical Decision Making  Medically screening exam initiated at 3:01 PM.  Appropriate orders placed.  Morgan Conway was informed that the remainder of the evaluation will be completed by another provider, this initial triage assessment does not replace that evaluation, and the importance of remaining in the ED until their evaluation is complete.  Abd pain. Lactic, labs, ekg ct ordered   , Gailen Shelter 01/03/22 1504

## 2023-02-22 IMAGING — CT CT ABD-PELV W/ CM
2 of 5 series · 16 of 46 positions shown, 18 images · IV contrast (Omni 300)
Comparison: None Available.

CLINICAL DATA: Left lower quadrant pain for several days

EXAM:
CT ABDOMEN AND PELVIS WITH CONTRAST
TECHNIQUE: Multidetector CT imaging of the abdomen and pelvis was performed
using the standard protocol following bolus administration of
intravenous contrast.

[Series 3: a/p w/ 5mm · axial · 0.72mm/px · z∈[+857,+1232]mm · 13 of 85 slices shown, 15 images]
[im 5/85  soft-tissue]
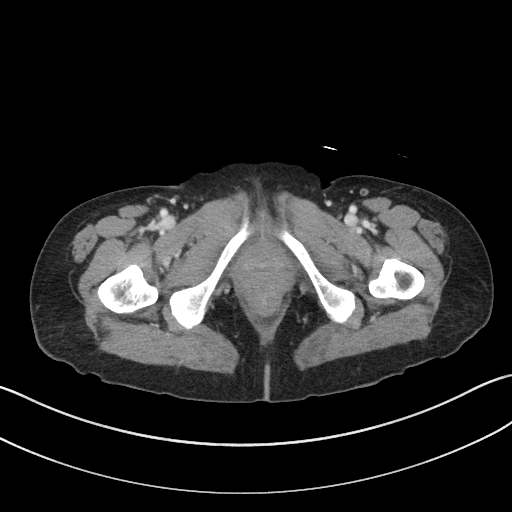
[im 5/85  bone]
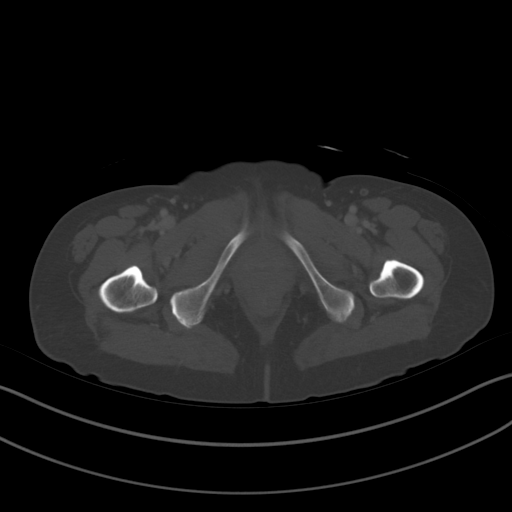
[im 14/85  soft-tissue]
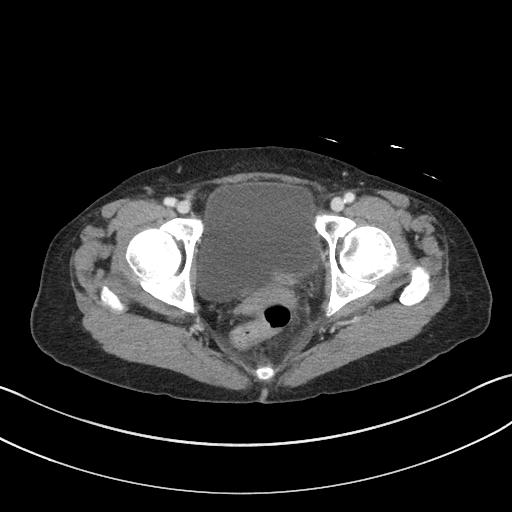
[im 18/85  soft-tissue]
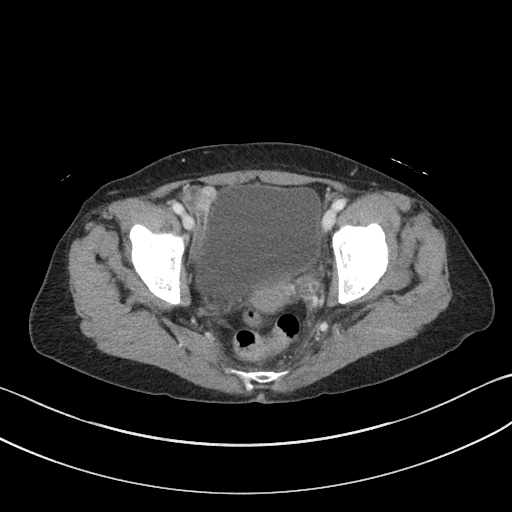
[im 23/85  soft-tissue]
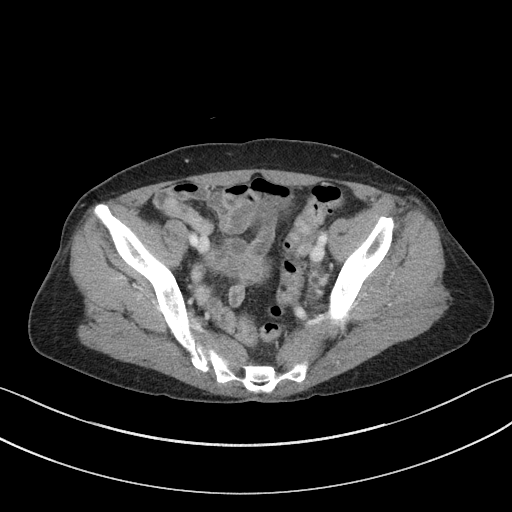
[im 31/85  soft-tissue]
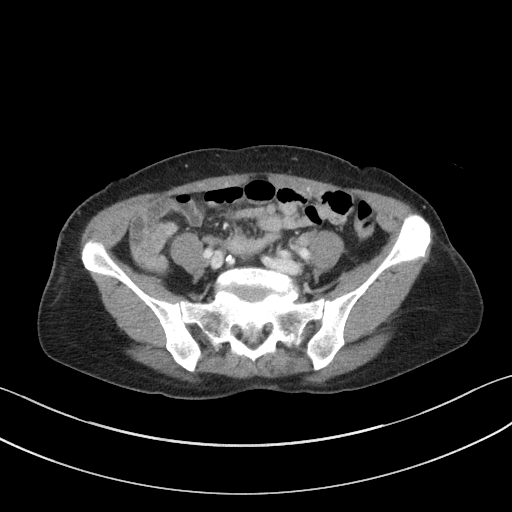
[im 36/85  soft-tissue]
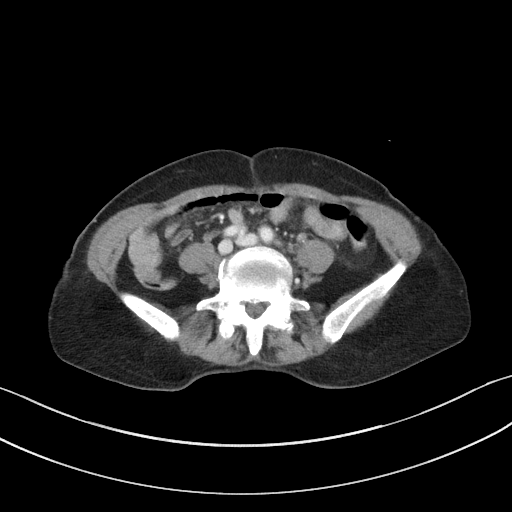
[im 45/85  soft-tissue]
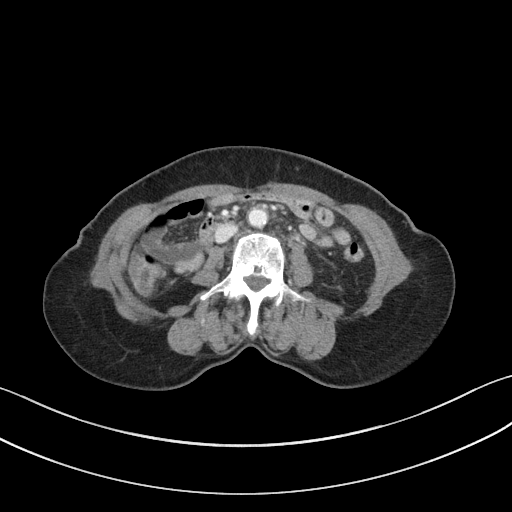
[im 49/85  soft-tissue]
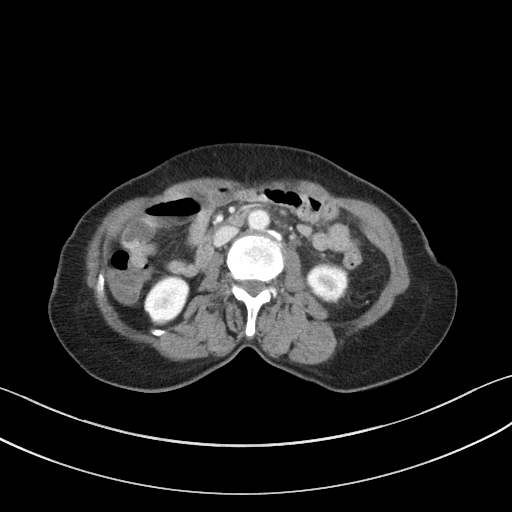
[im 54/85  soft-tissue]
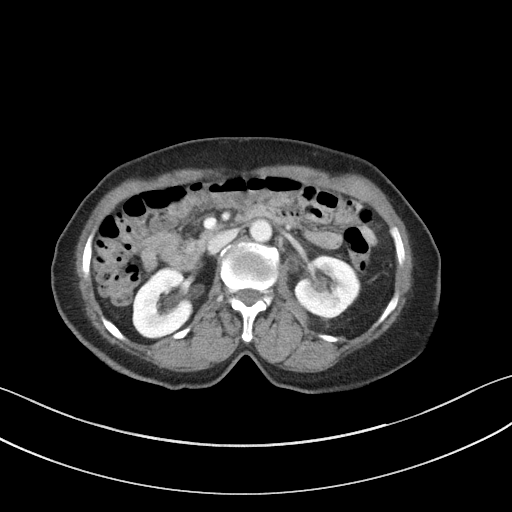
[im 54/85  bone]
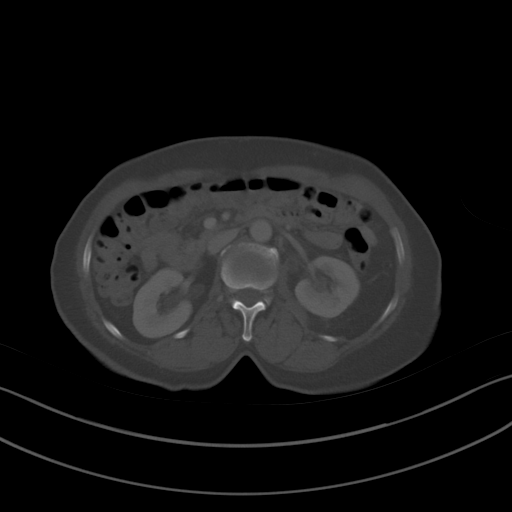
[im 62/85  soft-tissue]
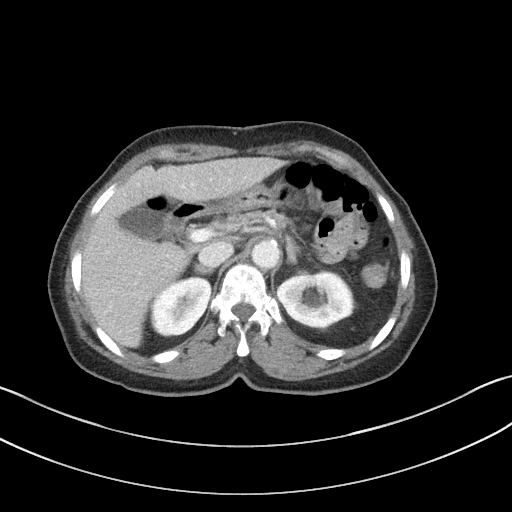
[im 67/85  soft-tissue]
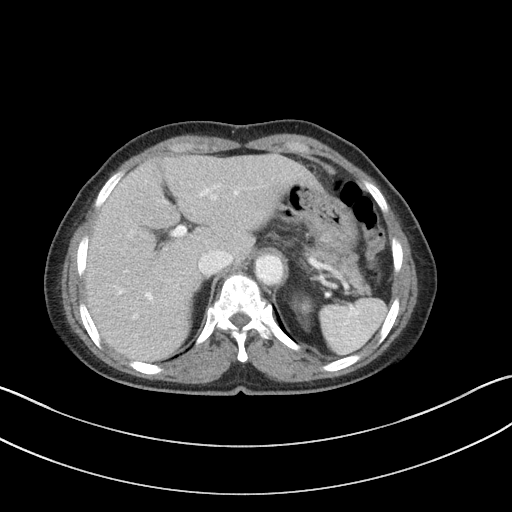
[im 71/85  soft-tissue]
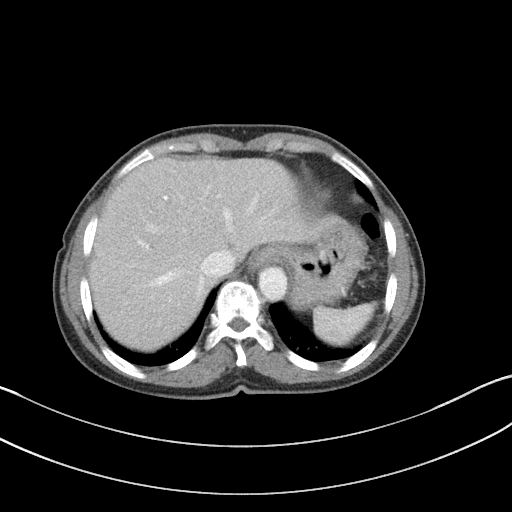
[im 80/85  soft-tissue]
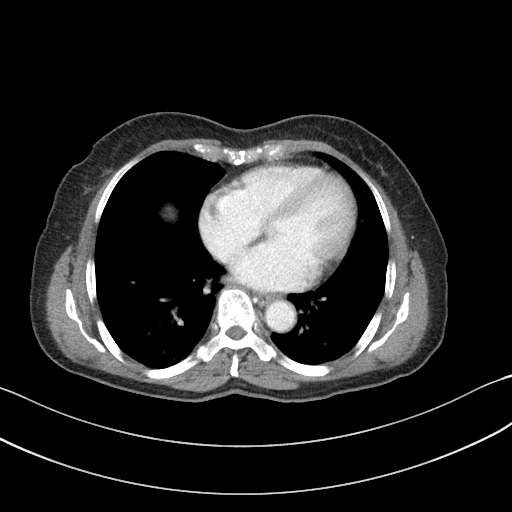

[Series 6: a/p w/ cor · coronal · 0.68mm/px · 3 of 110 slices shown]
[im 37/110  soft-tissue]
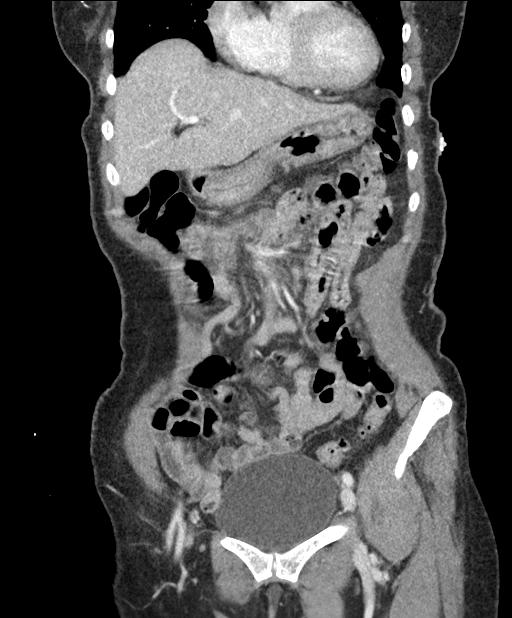
[im 49/110  soft-tissue]
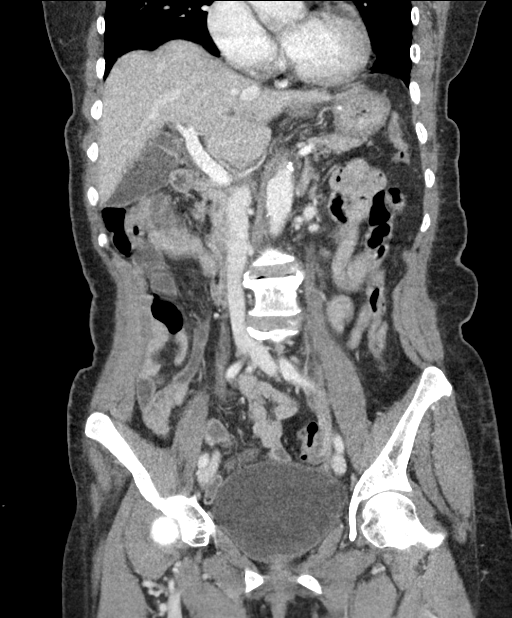
[im 61/110  soft-tissue]
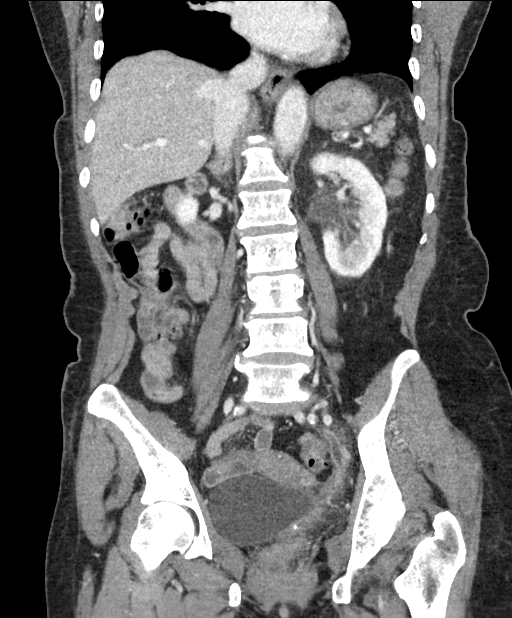

[16 of 46 positions shown; findings below may reference images not displayed]

RADIATION DOSE REDUCTION: This exam was performed according to the
departmental dose-optimization program which includes automated
exposure control, adjustment of the mA and/or kV according to
patient size and/or use of iterative reconstruction technique.

CONTRAST:  100mL OMNIPAQUE IOHEXOL 300 MG/ML  SOLN
FINDINGS: Lower chest: Mild dependent atelectatic changes are noted. No focal
infiltrate is seen.

Hepatobiliary: No focal liver abnormality is seen. No gallstones,
gallbladder wall thickening, or biliary dilatation.

Pancreas: Pancreas is atrophic.

Spleen: Normal in size without focal abnormality.

Adrenals/Urinary Tract: Adrenal glands are within normal limits.
Kidneys are well visualized bilaterally within normal enhancement
pattern. A few small subcentimeter cysts are noted the left kidney.
No follow-up is recommended. Fullness of the left renal collecting
system and left ureter is seen which extends to the level of the
ureterovesical junction. A punctate 1 mm stone is noted at that
level causing the obstructive change. Additionally the insertion
site is somewhat lower than expected. Bladder is well distended.

Stomach/Bowel: Mild diverticular change of the colon is noted
without evidence of diverticulitis. No obstructive or inflammatory
changes are seen. The appendix is well visualized and within normal
limits. No inflammatory changes are seen. Small bowel and stomach
are within normal limits.

Vascular/Lymphatic: Aortic atherosclerosis. No enlarged abdominal or
pelvic lymph nodes.

Reproductive: Uterus and bilateral adnexa are unremarkable.

Other: No abdominal wall hernia or abnormality. No abdominopelvic
ascites.

Musculoskeletal: No acute or significant osseous findings.
IMPRESSION: 1 mm stone at the left UVJ with mild obstructive changes identified.

Diverticulosis without diverticulitis.
